# Patient Record
Sex: Female | Born: 1970 | Race: White | Hispanic: No | Marital: Married | Smoking: Never smoker
Health system: Southern US, Community
[De-identification: ages and names within clinical notes are randomized; demographics above are authoritative.]

---

## 1997-07-26 ENCOUNTER — Other Ambulatory Visit: Admission: RE | Admit: 1997-07-26 | Discharge: 1997-07-26 | Payer: Self-pay | Admitting: Emergency Medicine

## 1998-05-04 ENCOUNTER — Other Ambulatory Visit: Admission: RE | Admit: 1998-05-04 | Discharge: 1998-05-04 | Payer: Self-pay | Admitting: Emergency Medicine

## 2002-06-09 ENCOUNTER — Other Ambulatory Visit: Admission: RE | Admit: 2002-06-09 | Discharge: 2002-06-09 | Payer: Self-pay

## 2002-10-10 ENCOUNTER — Other Ambulatory Visit: Admission: RE | Admit: 2002-10-10 | Discharge: 2002-10-10 | Payer: Self-pay

## 2003-02-07 ENCOUNTER — Other Ambulatory Visit: Admission: RE | Admit: 2003-02-07 | Discharge: 2003-02-07 | Payer: Self-pay | Admitting: Obstetrics and Gynecology

## 2003-08-08 ENCOUNTER — Other Ambulatory Visit: Admission: RE | Admit: 2003-08-08 | Discharge: 2003-08-08 | Payer: Self-pay | Admitting: Obstetrics and Gynecology

## 2004-02-05 ENCOUNTER — Other Ambulatory Visit: Admission: RE | Admit: 2004-02-05 | Discharge: 2004-02-05 | Payer: Self-pay | Admitting: Obstetrics and Gynecology

## 2004-11-14 ENCOUNTER — Other Ambulatory Visit: Admission: RE | Admit: 2004-11-14 | Discharge: 2004-11-14 | Payer: Self-pay | Admitting: Obstetrics and Gynecology

## 2004-11-15 ENCOUNTER — Ambulatory Visit (HOSPITAL_COMMUNITY): Admission: RE | Admit: 2004-11-15 | Discharge: 2004-11-15 | Payer: Self-pay | Admitting: Obstetrics and Gynecology

## 2005-03-07 ENCOUNTER — Inpatient Hospital Stay (HOSPITAL_COMMUNITY): Admission: AD | Admit: 2005-03-07 | Discharge: 2005-03-07 | Payer: Self-pay | Admitting: Obstetrics and Gynecology

## 2005-03-24 ENCOUNTER — Inpatient Hospital Stay (HOSPITAL_COMMUNITY): Admission: AD | Admit: 2005-03-24 | Discharge: 2005-03-24 | Payer: Self-pay | Admitting: Obstetrics and Gynecology

## 2005-03-28 ENCOUNTER — Ambulatory Visit: Admission: RE | Admit: 2005-03-28 | Discharge: 2005-03-28 | Payer: Self-pay | Admitting: Obstetrics and Gynecology

## 2005-05-09 ENCOUNTER — Observation Stay (HOSPITAL_COMMUNITY): Admission: AD | Admit: 2005-05-09 | Discharge: 2005-05-10 | Payer: Self-pay | Admitting: Obstetrics and Gynecology

## 2005-05-15 ENCOUNTER — Inpatient Hospital Stay (HOSPITAL_COMMUNITY): Admission: AD | Admit: 2005-05-15 | Discharge: 2005-05-18 | Payer: Self-pay | Admitting: Obstetrics and Gynecology

## 2005-05-16 ENCOUNTER — Encounter (INDEPENDENT_AMBULATORY_CARE_PROVIDER_SITE_OTHER): Payer: Self-pay | Admitting: *Deleted

## 2005-05-24 ENCOUNTER — Inpatient Hospital Stay (HOSPITAL_COMMUNITY): Admission: AD | Admit: 2005-05-24 | Discharge: 2005-05-27 | Payer: Self-pay | Admitting: Obstetrics and Gynecology

## 2005-06-23 ENCOUNTER — Encounter: Admission: RE | Admit: 2005-06-23 | Discharge: 2005-06-23 | Payer: Self-pay | Admitting: Family Medicine

## 2006-03-03 ENCOUNTER — Other Ambulatory Visit: Admission: RE | Admit: 2006-03-03 | Discharge: 2006-03-03 | Payer: Self-pay | Admitting: Obstetrics and Gynecology

## 2006-07-23 ENCOUNTER — Encounter: Admission: RE | Admit: 2006-07-23 | Discharge: 2006-07-23 | Payer: Self-pay | Admitting: Family Medicine

## 2006-11-03 ENCOUNTER — Encounter: Admission: RE | Admit: 2006-11-03 | Discharge: 2006-11-03 | Payer: Self-pay | Admitting: Obstetrics and Gynecology

## 2007-04-12 ENCOUNTER — Other Ambulatory Visit: Admission: RE | Admit: 2007-04-12 | Discharge: 2007-04-12 | Payer: Self-pay | Admitting: Obstetrics and Gynecology

## 2008-04-20 ENCOUNTER — Other Ambulatory Visit: Admission: RE | Admit: 2008-04-20 | Discharge: 2008-04-20 | Payer: Self-pay | Admitting: Obstetrics and Gynecology

## 2009-02-11 ENCOUNTER — Inpatient Hospital Stay (HOSPITAL_COMMUNITY): Admission: AD | Admit: 2009-02-11 | Discharge: 2009-02-14 | Payer: Self-pay | Admitting: Obstetrics and Gynecology

## 2009-02-16 ENCOUNTER — Encounter: Admission: RE | Admit: 2009-02-16 | Discharge: 2009-02-19 | Payer: Self-pay | Admitting: Obstetrics and Gynecology

## 2009-05-07 ENCOUNTER — Other Ambulatory Visit: Admission: RE | Admit: 2009-05-07 | Discharge: 2009-05-07 | Payer: Self-pay | Admitting: Obstetrics and Gynecology

## 2010-03-29 ENCOUNTER — Encounter
Admission: RE | Admit: 2010-03-29 | Discharge: 2010-03-29 | Payer: Self-pay | Source: Home / Self Care | Attending: Family Medicine | Admitting: Family Medicine

## 2010-05-09 ENCOUNTER — Other Ambulatory Visit: Payer: Self-pay | Admitting: Obstetrics and Gynecology

## 2010-05-09 ENCOUNTER — Other Ambulatory Visit (HOSPITAL_COMMUNITY)
Admission: RE | Admit: 2010-05-09 | Discharge: 2010-05-09 | Disposition: A | Discharge status: Home / Self Care | Payer: BC Managed Care – PPO | Source: Ambulatory Visit | Attending: Obstetrics and Gynecology | Admitting: Obstetrics and Gynecology

## 2010-05-09 DIAGNOSIS — Z01419 Encounter for gynecological examination (general) (routine) without abnormal findings: Secondary | ICD-10-CM | POA: Insufficient documentation

## 2010-07-17 LAB — CBC
HCT: 33.2 % — ABNORMAL LOW (ref 36.0–46.0)
HCT: 33.9 % — ABNORMAL LOW (ref 36.0–46.0)
Hemoglobin: 11.2 g/dL — ABNORMAL LOW (ref 12.0–15.0)
Hemoglobin: 11.6 g/dL — ABNORMAL LOW (ref 12.0–15.0)
MCHC: 33.8 g/dL (ref 30.0–36.0)
MCHC: 34.1 g/dL (ref 30.0–36.0)
MCV: 95.7 fL (ref 78.0–100.0)
MCV: 96.3 fL (ref 78.0–100.0)
Platelets: 152 10*3/uL (ref 150–400)
Platelets: 194 10*3/uL (ref 150–400)
RBC: 3.45 MIL/uL — ABNORMAL LOW (ref 3.87–5.11)
RBC: 3.55 MIL/uL — ABNORMAL LOW (ref 3.87–5.11)
RDW: 14.3 % (ref 11.5–15.5)
RDW: 14.5 % (ref 11.5–15.5)
WBC: 12.6 10*3/uL — ABNORMAL HIGH (ref 4.0–10.5)
WBC: 15.4 10*3/uL — ABNORMAL HIGH (ref 4.0–10.5)

## 2010-07-17 LAB — RPR: RPR Ser Ql: NONREACTIVE

## 2010-08-30 NOTE — H&P (Signed)
NAMESHARANYA, TEMPLIN               ACCOUNT NO.:  [id number]   MEDICAL RECORD NO.:  [id number]          PATIENT TYPE:  INP   LOCATION:  9154                          FACILITY:  [facility]   PHYSICIAN:  Gerald Leitz, MD          DATE OF BIRTH:  [birth year]   DATE OF ADMISSION:  05/09/2005  DATE OF DISCHARGE:                                HISTORY & PHYSICAL   A 36 and 4-week intrauterine pregnancy with elevated blood pressures, rule  out preeclampsia.   HISTORY OF PRESENT ILLNESS:  This is a 40 year old G1, P0 at 8 and 4 weeks  intrauterine pregnancy based on last menstrual period of Aug 27, 2004  confirmed by an 8-week ultrasound with an EDD of June 02, 2005.  Pregnancy complicated by advanced maternal age and asthma.  Patient  presented to the office today with complaints of a headache that is  unresolved with Tylenol.  Blood pressure was 140/100 on arrival.  Repeat was  132/92.  She denies any blurred vision or seeing spots.  No right upper  quadrant pain.  No shortness of breath.  Positive fetal movements.  No  vaginal bleeding.  No contractions.  No leakage of fluid.   PRENATAL LABORATORIES:  Blood type O+.  Rh negative.  RPR nonreactive.  Rubella immune.  Hepatitis B negative.  HIV negative.  Glucola is 125.  Pap  smear is normal.  Gonorrhea and Chlamydia tests were both negative.  Group B  Strep positive.   PAST MEDICAL HISTORY:  Asthma and seasonal allergies.   CURRENT MEDICATIONS:  Prenatal vitamins, Advair, albuterol, Claritin,  Zyrtec, Tylenol p.r.n.   ALLERGIES:  None.  No known drug allergies.   PAST OB HISTORY:  Current.   PAST GYN HISTORY:  History of abnormal Pap smears status post LEEP in 2003.  Last Pap smear was August of 2006 and this was normal.  No history of any  other sexually transmitted diseases.   FAMILY HISTORY:  Noncontributory.   SOCIAL HISTORY:  Patient is married.  She denies tobacco, alcohol, or  illicit drug use.   REVIEW OF SYSTEMS:  Negative  except as stated in history of present illness.   PHYSICAL EXAMINATION:  VITAL SIGNS:  Fetal heart rate was 160 in the office,  blood pressure 140/100, repeat 132/92.  CARDIOVASCULAR:  Regular rate and rhythm.  LUNGS:  Clear to auscultation bilaterally without rales, rhonchi, wheezes.  ABDOMEN:  Gravid, nontender.  No right upper quadrant pain with palpation.  EXTREMITIES:  No clubbing, cyanosis, edema.  One beat of clonus.  2+  reflexes.  PELVIC:  Cervical examination is deferred.   IMPRESSION AND PLAN:  36 and 4-week intrauterine pregnancy with elevated  blood pressures.  Will admit to [facility] for bed rest, 24-hour urine  to rule out preeclampsia and HELLP laboratories.      Gerald Leitz, MD  Electronically Signed     TC/MEDQ  D:  05/09/2005  T:  05/09/2005  Job:  [id number]

## 2010-08-30 NOTE — Op Note (Signed)
Katherine Hines, Katherine Hines               ACCOUNT NO.:  [id number]   MEDICAL RECORD NO.:  [id number]          PATIENT TYPE:  INP   LOCATION:  9173                          [facility]ACILITY:  [facility]   PHYSICIAN:  Charles A. Delcambre, MDDATE OF BIRTH:  December 01, 1970   DATE OF PROCEDURE:  05/16/2005  DATE OF DISCHARGE:                                 OPERATIVE REPORT   This patient was admitted for induction secondary to gestational  hypertension, not felt to be preeclamptic, and decreased fetal movement and  possible suspicious decelerations on NST in the office.  She is also  advanced maternal age, group B Strep positive.  She progressed on, after  spontaneous rupture of membranes this morning, to respond well to Pitocin up  to 6 milli-international units/min and became completely dilated at 0834.  She pushed for approximately 1 hour to spontaneous vaginal delivery of  vigorous female, Apgars 8 and 9.  Father of baby cut the cord, nuchal cord  x1, body cord x1 were delivered through.  Cord venous blood gas 7.32,  arterial blood gas difficult with specimen too small to run.  Placenta was  intact but was torn centrally.  Reassembly by putting the segments back  together, they were intact but torn.  Putting them  back together appeared  to be intact.  The placenta was 3 vessels and noted to be spontaneous.  Digital uterine palpation and exploration did not palpate any retained  placental fragments and we made the judgment that if any fever was noted  postpartum would possibly need to proceed with retained placental workup.  In my judgment, I did not feel we needed to proceed with D&C at this time.  There was a small right periurethral skid mark type laceration, no repair  necessary, hemostasis was noted to be good.  There was a left vaginal side  wall laceration approximately 2 cm.  A local anesthetic was used with 2-0  Vicryl to repair, with 3 interrupted stitches, this area with good  hemostasis resulting.   Rectal exam was normal.  Mother and baby recovering  stably at this time.      Charles A. Sydnee Cabal, MD  Electronically Signed     CAD/MEDQ  D:  05/16/2005  T:  05/16/2005  Job:  [id number]

## 2010-08-30 NOTE — H&P (Signed)
Katherine Hines, Katherine Hines               ACCOUNT NO.:  [id number]   MEDICAL RECORD NO.:  [id number]          PATIENT TYPE:  INP   LOCATION:  9173                          [facility]  [facility]   PHYSICIAN:  Charles A. Delcambre, MDDATE OF BIRTH:  [birth year]   DATE OF ADMISSION:  05/15/2005  DATE OF DISCHARGE:                                HISTORY & PHYSICAL   CHIEF COMPLAINT:  1.  Gestational hypertension.  2.  Possible suspicious decelerations on NST in the office.  3.  Recent history of decreased fetal movement with reassuring antenatal      testing yesterday, biophysical 10/10.  4.  Term intrauterine pregnancy 37 weeks 3 days.  5.  Advanced maternal age.  6.  Positive group B strep.  7.  History of asthma, not active at this time; well controlled with      medications.   HISTORY OF PRESENT ILLNESS:  A 39 year old gravida 1 para 0 at 37 weeks 3  days noted to have an NST reactive but two suspicious decelerations after  two of three contractions on the NST. These were very subtle and the patient  is now admitted secondary to ongoing headache and history of recent  decreased fetal movement. Active fetal movement is noted today. Biophysical  profile was 10/10 yesterday. After initial slight decreased movement but  qualifying as active movement but 0 for tone was noted on biophysical  yesterday. In extended monitoring, tone did improve and movement was noted,  adequate. NST was reactive. PIH labs were done May 08, 2005, showing  creatinine 0.8, uric acid 3.8, AST 25, ALT 23. Hemoglobin 12.2, hematocrit  35.3, platelets 277. Group B strep was positive on May 05, 2005.   PAST MEDICAL HISTORY:  1.  Asthma.  2.  Seasonal allergies.  3.  Gestational hypertension as noted above. She has been on bedrest for the      last several weeks, at least on a limited basis. Blood pressure was as      high as 140/100 last weekend, normalized to 130s over 80s with hospital      admission overnight. As  she was in the 36 weeks range she was not      induced with response of her blood pressure and normal labs. Headache      did respond to butalbital and she was treated with such as blood      pressure stabilized and PIH labs were normal.  4.  History of pyelonephritis, not during pregnancy and in the remote past.   MEDICATIONS:  1.  Advair 250/50 once a day.  2.  Zyrtec 10 mg once a day.  3.  Albuterol inhaler p.r.n. q.4-6h. two puffs.  4.  Prenatal vitamin once a day.   PAST SURGICAL HISTORY:  None other than a LEEP.   ALLERGIES:  No known drug allergies.   SOCIAL HISTORY:  She is married. No tobacco, ethanol, or drug use.   REVIEW OF SYSTEMS:  Active fetal movement is noted. No contractions,  rupture, or bleeding is noted at this time. She does not feel the  contractions  we saw on the NST other than some slight tightening with one  around the time of one of the contractions but not clearly at the time that  is marked on the monitor. She has mild headache this morning. No scotomata,  right upper quadrant pain, or nausea or vomiting. No diarrhea, constipation,  or bleeding per rectum. No urgency, frequency or dysuria. No vision changes.  No fever or chills.   PHYSICAL EXAMINATION:  GENERAL:  Alert and oriented x3.  VITAL SIGNS:  Blood pressure 130/88, weight 214 pounds, respirations 18,  pulse 90.  HEENT:  Grossly within normal limits.  NECK:  Supple without thyromegaly or adenopathy.  BACK:  No CVAT.  BREASTS:  Symmetrical; otherwise, exam deferred.  HEART:  Regular rate and rhythm, 2/6 systolic ejection murmur left sternal  border.  LUNGS:  Clear without audible wheezing.  ABDOMEN:  Fundal height 37. Estimated fetal weight 3200 g.  PELVIC:  Cervix fingertip, posterior, and soft. On ultrasound yesterday, the  baby was vertex.  EXTREMITIES:  Mild edema bilaterally and symmetrical. Reflexes 1-2+,  symmetrical.   ASSESSMENT:  1.  Intrauterine pregnancy at term, 37 weeks 3  days.  2.  Gestational hypertension.  3.  Some suspicious subtle decelerations seen on nonstress test today.   PLAN:  Cervidil induction. PIH panel, CBC, diabetes screen upon admission.  Monitor closely. If further decelerations of recurrent nature will proceed  with C-section with remote condition from delivery. Otherwise, continue  Cervidil induction for 12 hours and then likely Pitocin. Dr. Richardson Dopp will be on-  call this evening. If we proceed into the evening with induction, she will  take over management per her orders at that time. If hypertension does  develop of significance or headache persists with hypertension, will  consider magnesium therapy. All questions are answered and will check urine  upon admission as well for protein. All questions are answered and we will  proceed as outlined.      Charles A. Sydnee Cabal, MD  Electronically Signed     CAD/MEDQ  D:  05/15/2005  T:  05/15/2005  Job:  [id number]

## 2010-08-30 NOTE — Discharge Summary (Signed)
[name], Katherine Hines               ACCOUNT NO.:  [id number]   MEDICAL RECORD NO.:  [id number]          PATIENT TYPE:  INP   LOCATION:  9317                          [facility]ILITY[facility]  [facility]   PHYSICIAN:  Charles A. Delcambre, MDDATE OF BIRTH:  Dec 21, 1970   DATE OF ADMISSION:  05/24/2005  DATE OF DISCHARGE:  05/27/2005                                 DISCHARGE SUMMARY   PRIMARY DISCHARGE DIAGNOSES:  1.  Gestational hypertension, 1 week postpartum.  2.  Urinary tract infection.   PROCEDURE:  IV magnesium prophylaxis and p.o. antibiotics, and  antihypertensive therapy.   DISPOSITION:  The patient will return to clinic in 1 week.  She was  discharged home with pregnancy induced hypertension precautions:  To notify  of increased headache or scotomata, right upper quadrant pain or increased  swelling, she was to take labetalol 200 mg p.o. q.12h., Macrobid 100 mg p.o.  q.12h. to complete a 10-day course of Macrobid, notify of any fever or  increased dysuria, also notify of any shortness of breath or wheezing.   LABORATORY:  PIH labs were normal.  Admission SGOT 28, SGPT 30, LDH 237,  uric acid 4.7, urinalysis consistent with rare bacteria, RBCs too numerous  to count, 0 to 2 white blood cells, but she was treated for a urinary tract  infection upon admission.  On CBC furthermore:  Platelets 403, hemoglobin  11.4, hematocrit 32.4 and white count 11.4.  Otherwise, chem profile was  noncontributory and basically unremarkable.   HISTORY AND PHYSICAL:  Written in the chart.   HOSPITAL COURSE:  The patient was admitted to the AICU and magnesium therapy  was undertaken.  She was increased from 100 mg b.i.d. of labetalol, upon  admission, to 200 mg b.i.d.  She was given 1 dose of Ancef IV upon admission  and thereafter put her on Macrobid.  She responded to the antihypertensives  and diuresed well.  Over the first 24 hours, diuresed to a negative balance  of just over 4 L.  She felt much better,  headache decreased and she  continues to do well on hospital day #2 after being off the magnesium, and  had minimal to none headache on hospital day 2 and continued to do well and  she was therefore discharged home with follow up as noted above.      Charles A. Sydnee Cabal, MD  Electronically Signed     CAD/MEDQ  D:  05/27/2005  T:  05/27/2005  Job:  [id number]

## 2011-02-11 ENCOUNTER — Other Ambulatory Visit: Payer: Self-pay | Admitting: Family Medicine

## 2011-02-12 ENCOUNTER — Ambulatory Visit
Admission: RE | Admit: 2011-02-12 | Discharge: 2011-02-12 | Disposition: A | Discharge status: Home / Self Care | Payer: BC Managed Care – PPO | Source: Ambulatory Visit | Attending: Family Medicine | Admitting: Family Medicine

## 2011-02-12 MED ORDER — IOHEXOL 300 MG/ML  SOLN
100.0000 mL | Freq: Once | INTRAMUSCULAR | Status: AC | PRN
  Administered 2011-02-12: 100 mL via INTRAVENOUS

## 2011-06-11 ENCOUNTER — Other Ambulatory Visit (HOSPITAL_COMMUNITY)
Admission: RE | Admit: 2011-06-11 | Discharge: 2011-06-11 | Disposition: A | Discharge status: Home / Self Care | Payer: BC Managed Care – PPO | Source: Ambulatory Visit | Attending: Obstetrics and Gynecology | Admitting: Obstetrics and Gynecology

## 2011-06-11 ENCOUNTER — Other Ambulatory Visit: Payer: Self-pay | Admitting: Obstetrics and Gynecology

## 2011-06-11 DIAGNOSIS — Z01419 Encounter for gynecological examination (general) (routine) without abnormal findings: Secondary | ICD-10-CM | POA: Insufficient documentation

## 2011-06-30 ENCOUNTER — Other Ambulatory Visit: Payer: Self-pay | Admitting: Obstetrics and Gynecology

## 2011-06-30 DIAGNOSIS — Z1231 Encounter for screening mammogram for malignant neoplasm of breast: Secondary | ICD-10-CM

## 2011-07-29 ENCOUNTER — Ambulatory Visit
Admission: RE | Admit: 2011-07-29 | Discharge: 2011-07-29 | Disposition: A | Discharge status: Home / Self Care | Payer: BC Managed Care – PPO | Source: Ambulatory Visit | Attending: Obstetrics and Gynecology | Admitting: Obstetrics and Gynecology

## 2011-07-29 DIAGNOSIS — Z1231 Encounter for screening mammogram for malignant neoplasm of breast: Secondary | ICD-10-CM

## 2011-07-31 ENCOUNTER — Emergency Department (HOSPITAL_COMMUNITY): Payer: BC Managed Care – PPO

## 2011-07-31 ENCOUNTER — Encounter (HOSPITAL_COMMUNITY): Payer: Self-pay | Admitting: *Deleted

## 2011-07-31 ENCOUNTER — Emergency Department (HOSPITAL_COMMUNITY)
Admission: EM | Admit: 2011-07-31 | Discharge: 2011-07-31 | Disposition: A | Discharge status: Home / Self Care | Payer: BC Managed Care – PPO | Attending: Emergency Medicine | Admitting: Emergency Medicine

## 2011-07-31 DIAGNOSIS — S01501A Unspecified open wound of lip, initial encounter: Secondary | ICD-10-CM | POA: Insufficient documentation

## 2011-07-31 DIAGNOSIS — S01511A Laceration without foreign body of lip, initial encounter: Secondary | ICD-10-CM

## 2011-07-31 DIAGNOSIS — W1809XA Striking against other object with subsequent fall, initial encounter: Secondary | ICD-10-CM | POA: Insufficient documentation

## 2011-07-31 DIAGNOSIS — Y9239 Other specified sports and athletic area as the place of occurrence of the external cause: Secondary | ICD-10-CM | POA: Insufficient documentation

## 2011-07-31 DIAGNOSIS — S161XXA Strain of muscle, fascia and tendon at neck level, initial encounter: Secondary | ICD-10-CM

## 2011-07-31 DIAGNOSIS — S139XXA Sprain of joints and ligaments of unspecified parts of neck, initial encounter: Secondary | ICD-10-CM | POA: Insufficient documentation

## 2011-07-31 DIAGNOSIS — M542 Cervicalgia: Secondary | ICD-10-CM | POA: Insufficient documentation

## 2011-07-31 DIAGNOSIS — Y9351 Activity, roller skating (inline) and skateboarding: Secondary | ICD-10-CM | POA: Insufficient documentation

## 2011-07-31 DIAGNOSIS — S60229A Contusion of unspecified hand, initial encounter: Secondary | ICD-10-CM | POA: Insufficient documentation

## 2011-07-31 MED ORDER — HYDROCODONE-ACETAMINOPHEN 5-325 MG PO TABS: 1.0000 | ORAL_TABLET | ORAL | Status: AC | PRN

## 2011-07-31 NOTE — ED Notes (Signed)
Patient transported to X-ray 

## 2011-07-31 NOTE — ED Provider Notes (Signed)
History     CSN: [id number]  Arrival date & time 07/31/11  2019   First MD Initiated Contact with Patient 07/31/11 2100      Chief Complaint  Patient presents with  . Fall    (Consider location/radiation/quality/duration/timing/severity/associated sxs/prior treatment) HPI Comments: Patient here after slipping while skating with her family - reports that she ran into a pole face first, then fell onto her right hand - denies LOC, reports laceration to right upper lip, neck pain, and right hand pain.  No deformity noted.  Patient is a 40 y.o. female presenting with fall. The history is provided by the patient. No language interpreter was used.  Fall The accident occurred 1 to 2 hours ago. The fall occurred while recreating/playing. She fell from a height of 3 to 5 ft. She landed on a hard floor. The volume of blood lost was minimal. The point of impact was the head. The pain is present in the head (neck and right hand). The pain is at a severity of 5/10. The pain is moderate. She was ambulatory at the scene. There was no entrapment after the fall. There was no drug use involved in the accident. There was no alcohol use involved in the accident. Associated symptoms include headaches. Pertinent negatives include no visual change, no fever, no numbness, no abdominal pain, no bowel incontinence, no nausea, no vomiting, no hematuria, no hearing loss, no loss of consciousness and no tingling. The symptoms are aggravated by activity. Treatment on scene includes a c-collar. She has tried nothing for the symptoms. The treatment provided no relief.    History reviewed. No pertinent past medical history.  History reviewed. No pertinent past surgical history.  History reviewed. No pertinent family history.  History  Substance Use Topics  . Smoking status: Never Smoker   . Smokeless tobacco: Not on file  . Alcohol Use: No    OB History    Grav Para Term Preterm Abortions TAB SAB Ect Mult Living                    Review of Systems  Constitutional: Negative for fever.  HENT: Positive for facial swelling, neck pain and neck stiffness. Negative for nosebleeds and congestion.   Eyes: Negative for pain.  Respiratory: Negative for chest tightness and shortness of breath.   Gastrointestinal: Negative for nausea, vomiting, abdominal pain and bowel incontinence.  Genitourinary: Negative for hematuria.  Musculoskeletal: Positive for joint swelling and arthralgias. Negative for back pain.  Neurological: Positive for headaches. Negative for tingling, loss of consciousness and numbness.  All other systems reviewed and are negative.    Allergies  Review of patient's allergies indicates no known allergies.  Home Medications   Current Outpatient Rx  Name Route Sig Dispense Refill  . CETIRIZINE HCL 10 MG PO TABS Oral Take 10 mg by mouth every morning.    Marland Kitchen ZOVIA 1/35E (28) PO Oral Take 1 tablet by mouth every morning.    Marland Kitchen FLUTICASONE-SALMETEROL 250-50 MCG/DOSE IN AEPB Inhalation Inhale 1 puff into the lungs every morning. May take 1 additional puff in the evening if needed for wheezing    . ADULT MULTIVITAMIN W/MINERALS CH Oral Take 1 tablet by mouth every morning.      BP 133/84  Pulse 81  Temp(Src) 97.3 F (36.3 C) (Oral)  Resp 18  SpO2 98%  LMP 07/17/2011  Physical Exam  Nursing note and vitals reviewed. Constitutional: She is oriented to person, place, and time. She  appears well-developed and well-nourished. No distress.  HENT:  Head: Normocephalic.  Right Ear: External ear normal.  Left Ear: External ear normal.  Nose: Nose normal.  Mouth/Throat: Oropharynx is clear and moist. No oropharyngeal exudate.       Right upper lip laceration through vermillion border but not into buccal mucosa.  Eyes: Conjunctivae are normal. Pupils are equal, round, and reactive to light. No scleral icterus.  Neck: Normal range of motion. Neck supple. Spinous process tenderness and muscular  tenderness present.  Cardiovascular: Normal rate, regular rhythm and normal heart sounds.  Exam reveals no gallop and no friction rub.   No murmur heard. Pulmonary/Chest: Effort normal and breath sounds normal. No respiratory distress. She has no wheezes. She has no rales. She exhibits no tenderness.  Abdominal: Soft. Bowel sounds are normal. She exhibits no distension. There is no tenderness.  Musculoskeletal: Normal range of motion. She exhibits tenderness. She exhibits no edema.       Right hand: She exhibits tenderness and bony tenderness. She exhibits normal range of motion, normal capillary refill, no deformity, no laceration and no swelling. normal sensation noted. Normal strength noted.       Hands: Lymphadenopathy:    She has no cervical adenopathy.  Neurological: She is alert and oriented to person, place, and time. No cranial nerve deficit.  Skin: Skin is warm and dry. No rash noted. No erythema. No pallor.  Psychiatric: She has a normal mood and affect. Her behavior is normal. Judgment and thought content normal.    ED Course  Procedures (including critical care time)  Labs Reviewed - No data to display Ct Cervical Spine Wo Contrast  07/31/2011  *RADIOLOGY REPORT*  Clinical Data:  Status post fall; hit face on pole.  Hyperextension injury to the neck, with laceration to the upper lip and chipped tooth.  CT MAXILLOFACIAL WITHOUT CONTRAST CT CERVICAL SPINE WITHOUT CONTRAST  Technique:  Multidetector CT imaging of the cervical spine, and maxillofacial structures were performed using the standard protocol without intravenous contrast. Multiplanar CT image reconstructions of the cervical spine and maxillofacial structures were also generated.  Comparison:   None  CT MAXILLOFACIAL  Findings:  There is no evidence of fracture or dislocation.  The maxilla and mandible appear intact.  The nasal bone is unremarkable in appearance.  The known chipped tooth is not well characterized on CT.  The  orbits are intact bilaterally.  The paranasal sinuses and mastoid air cells are well-aerated.  A soft tissue defect is noted along the right upper lip.  The parapharyngeal fat planes are preserved.  The nasopharynx, oropharynx and hypopharynx are unremarkable in appearance.  The visualized portions of the valleculae and piriform sinuses are grossly unremarkable.  The parotid and submandibular glands are within normal limits.  No cervical lymphadenopathy is seen.  IMPRESSION:  1.  No evidence of fracture or dislocation; known chipped tooth is not well characterized on CT. 2.  Soft tissue defect along the right upper lip.  CT CERVICAL SPINE  Findings:   There is no evidence of fracture or subluxation. Vertebral bodies demonstrate normal height and alignment. Intervertebral disc spaces are preserved.  Prevertebral soft tissues are within normal limits.  The visualized neural foramina are grossly unremarkable.  The thyroid gland is unremarkable in appearance.  The visualized lung apices are clear.  No significant soft tissue abnormalities are seen; scattered superficial soft tissue nodules likely reflect normal lymph nodes.  IMPRESSION:  No evidence of fracture or subluxation along the  cervical spine.  Original Report Authenticated By: Tonia Ghent, M.D.   Dg Hand Complete Right  07/31/2011  *RADIOLOGY REPORT*  Clinical Data: Fall.  Right wrist and thumb pain.  RIGHT HAND - COMPLETE 3+ VIEW  Comparison: None.  Findings: The wrist is located.  No acute bone or soft tissue abnormality is evident.  IMPRESSION: Negative right hand.  Original Report Authenticated By: Jamesetta Orleans. MATTERN, M.D.   Ct Maxillofacial Wo Cm  07/31/2011  *RADIOLOGY REPORT*  Clinical Data:  Status post fall; hit face on pole.  Hyperextension injury to the neck, with laceration to the upper lip and chipped tooth.  CT MAXILLOFACIAL WITHOUT CONTRAST CT CERVICAL SPINE WITHOUT CONTRAST  Technique:  Multidetector CT imaging of the cervical spine,  and maxillofacial structures were performed using the standard protocol without intravenous contrast. Multiplanar CT image reconstructions of the cervical spine and maxillofacial structures were also generated.  Comparison:   None  CT MAXILLOFACIAL  Findings:  There is no evidence of fracture or dislocation.  The maxilla and mandible appear intact.  The nasal bone is unremarkable in appearance.  The known chipped tooth is not well characterized on CT.  The orbits are intact bilaterally.  The paranasal sinuses and mastoid air cells are well-aerated.  A soft tissue defect is noted along the right upper lip.  The parapharyngeal fat planes are preserved.  The nasopharynx, oropharynx and hypopharynx are unremarkable in appearance.  The visualized portions of the valleculae and piriform sinuses are grossly unremarkable.  The parotid and submandibular glands are within normal limits.  No cervical lymphadenopathy is seen.  IMPRESSION:  1.  No evidence of fracture or dislocation; known chipped tooth is not well characterized on CT. 2.  Soft tissue defect along the right upper lip.  CT CERVICAL SPINE  Findings:   There is no evidence of fracture or subluxation. Vertebral bodies demonstrate normal height and alignment. Intervertebral disc spaces are preserved.  Prevertebral soft tissues are within normal limits.  The visualized neural foramina are grossly unremarkable.  The thyroid gland is unremarkable in appearance.  The visualized lung apices are clear.  No significant soft tissue abnormalities are seen; scattered superficial soft tissue nodules likely reflect normal lymph nodes.  IMPRESSION:  No evidence of fracture or subluxation along the cervical spine.  Original Report Authenticated By: Tonia Ghent, M.D.   LACERATION REPAIR Performed by: Patrecia Pour. Authorized by: Patrecia Pour Consent: Verbal consent obtained. Risks and benefits: risks, benefits and alternatives were discussed Consent given by:  patient Patient identity confirmed: provided demographic data Prepped and Draped in normal sterile fashion Wound explored  Laceration Location: right upper lip  Laceration Length: 2cm  No Foreign Bodies seen or palpated  Anesthesia: local infiltration  Local anesthetic: lidocaine 2% without epinephrine  Anesthetic total: 2 ml  Irrigation method: syringe Amount of cleaning: standard  Skin closure: nylon 7.0 outside and vicryl 6.0 inside  Number of sutures: 3 nylon on outside and 2 vicryl on inside  Technique: simple interrupted.  Patient tolerance: Patient tolerated the procedure well with no immediate complications.  Cervical Strain Upper lip laceration Right hand contusion    MDM  Patient here s/p fall with right hand contusion without fracture noted.  Cervical strain without weakness, numbness, tingling, loss of control of bowels or bladder and upper lip laceration.        Izola Price [place], [place] 07/31/11 2308

## 2011-07-31 NOTE — ED Provider Notes (Signed)
Medical screening examination/treatment/procedure(s) were performed by non-physician practitioner and as supervising physician I was immediately available for consultation/collaboration.   Loren Racer, MD 07/31/11 [phone]

## 2011-07-31 NOTE — Discharge Instructions (Signed)
Cervical Sprain A cervical sprain is an injury in the neck in which the ligaments are stretched or torn. The ligaments are the tissues that hold the bones of the neck (vertebrae) in place.Cervical sprains can range from very mild to very severe. Most cervical sprains get better in 1 to 3 weeks, but it depends on the cause and extent of the injury. Severe cervical sprains can cause the neck vertebrae to be unstable. This can lead to damage of the spinal cord and can result in serious nervous system problems. Your caregiver will determine whether your cervical sprain is mild or severe. CAUSES  Severe cervical sprains may be caused by:  Contact sport injuries (football, rugby, wrestling, hockey, auto racing, gymnastics, diving, martial arts, boxing).   Motor vehicle collisions.   Whiplash injuries. This means the neck is forcefully whipped backward and forward.   Falls.  Mild cervical sprains may be caused by:   Awkward positions, such as cradling a telephone between your ear and shoulder.   Sitting in a chair that does not offer proper support.   Working at a poorly designed computer station.   Activities that require looking up or down for long periods of time.  SYMPTOMS   Pain, soreness, stiffness, or a burning sensation in the front, back, or sides of the neck. This discomfort may develop immediately after injury or it may develop slowly and not begin for 24 hours or more after an injury.   Pain or tenderness directly in the middle of the back of the neck.   Shoulder or upper back pain.   Limited ability to move the neck.   Headache.   Dizziness.   Weakness, numbness, or tingling in the hands or arms.   Muscle spasms.   Difficulty swallowing or chewing.   Tenderness and swelling of the neck.  DIAGNOSIS  Most of the time, your caregiver can diagnose this problem by taking your history and doing a physical exam. Your caregiver will ask about any known problems, such as  arthritis in the neck or a previous neck injury. X-rays may be taken to find out if there are any other problems, such as problems with the bones of the neck. However, an X-ray often does not reveal the full extent of a cervical sprain. Other tests such as a computed tomography (CT) scan or magnetic resonance imaging (MRI) may be needed. TREATMENT  Treatment depends on the severity of the cervical sprain. Mild sprains can be treated with rest, keeping the neck in place (immobilization), and pain medicines. Severe cervical sprains need immediate immobilization and an appointment with an orthopedist or neurosurgeon. Several treatment options are available to help with pain, muscle spasms, and other symptoms. Your caregiver may prescribe:  Medicines, such as pain relievers, numbing medicines, or muscle relaxants.   Physical therapy. This can include stretching exercises, strengthening exercises, and posture training. Exercises and improved posture can help stabilize the neck, strengthen muscles, and help stop symptoms from returning.   A neck collar to be worn for short periods of time. Often, these collars are worn for comfort. However, certain collars may be worn to protect the neck and prevent further worsening of a serious cervical sprain.  HOME CARE INSTRUCTIONS   Put ice on the injured area.   Put ice in a plastic bag.   Place a towel between your skin and the bag.   Leave the ice on for 15 to 20 minutes, 3 to 4 times a day.     Only take over-the-counter or prescription medicines for pain, discomfort, or fever as directed by your caregiver.   Keep all follow-up appointments as directed by your caregiver.   Keep all physical therapy appointments as directed by your caregiver.   If a neck collar is prescribed, wear it as directed by your caregiver.   Do not drive while wearing a neck collar.   Make any needed adjustments to your work station to promote good posture.   Avoid positions  and activities that make your symptoms worse.   Warm up and stretch before being active to help prevent problems.  SEEK MEDICAL CARE IF:   Your pain is not controlled with medicine.   You are unable to decrease your pain medicine over time as planned.   Your activity level is not improving as expected.  SEEK IMMEDIATE MEDICAL CARE IF:   You develop any bleeding, stomach upset, or signs of an allergic reaction to your medicine.   Your symptoms get worse.   You develop new, unexplained symptoms.   You have numbness, tingling, weakness, or paralysis in any part of your body.  MAKE SURE YOU:   Understand these instructions.   Will watch your condition.   Will get help right away if you are not doing well or get worse.  Document Released: 01/26/2007 Document Revised: 03/20/2011 Document Reviewed: 01/01/2011 [facility] Patient Information 2012 [place], [place].Absorbable Suture Repair Absorbable sutures (stitches) hold skin together so you can heal. Keep skin wounds clean and dry for the next 2 to 3 days. Then, you may gently wash your wound and dress it with an antibiotic ointment as recommended. As your wound begins to heal, the sutures are no longer needed, and they typically begin to fall off. This will take 7 to 10 days. After 10 days, if your sutures are loose, you can remove them by wiping with a clean gauze pad or a cotton ball. Do not pull your sutures out. They should wipe away easily. If after 10 days they do not easily wipe away, have your caregiver take them out. Absorbable sutures may be used deep in a wound to help hold it together. If these stitches are below the skin, the body will absorb them completely in 3 to 4 weeks.  You may need a tetanus shot if:  You cannot remember when you had your last tetanus shot.   You have never had a tetanus shot.  If you get a tetanus shot, your arm may swell, get red, and feel warm to the touch. This is common and not a problem. If you need  a tetanus shot and you choose not to have one, there is a rare chance of getting tetanus. Sickness from tetanus can be serious. SEEK IMMEDIATE MEDICAL CARE IF:  You have redness in the wound area.   The wound area feels hot to the touch.   You develop swelling in the wound area.   You develop pain.   There is fluid drainage from the wound.  Document Released: 05/08/2004 Document Revised: 03/20/2011 Document Reviewed: 08/20/2010 [facility] Patient Information 2012 [place], [place].Contusion A contusion is a deep bruise. Contusions are the result of an injury that caused bleeding under the skin. The contusion may turn blue, purple, or yellow. Minor injuries will give you a painless contusion, but more severe contusions may stay painful and swollen for a few weeks.  CAUSES  A contusion is usually caused by a blow, trauma, or direct force to an area of the  body. SYMPTOMS   Swelling and redness of the injured area.   Bruising of the injured area.   Tenderness and soreness of the injured area.   Pain.  DIAGNOSIS  The diagnosis can be made by taking a history and physical exam. An X-ray, CT scan, or MRI may be needed to determine if there were any associated injuries, such as fractures. TREATMENT  Specific treatment will depend on what area of the body was injured. In general, the best treatment for a contusion is resting, icing, elevating, and applying cold compresses to the injured area. Over-the-counter medicines may also be recommended for pain control. Ask your caregiver what the best treatment is for your contusion. HOME CARE INSTRUCTIONS   Put ice on the injured area.   Put ice in a plastic bag.   Place a towel between your skin and the bag.   Leave the ice on for 15 to 20 minutes, 3 to 4 times a day.   Only take over-the-counter or prescription medicines for pain, discomfort, or fever as directed by your caregiver. Your caregiver may recommend avoiding anti-inflammatory  medicines (aspirin, ibuprofen, and naproxen) for 48 hours because these medicines may increase bruising.   Rest the injured area.   If possible, elevate the injured area to reduce swelling.  SEEK IMMEDIATE MEDICAL CARE IF:   You have increased bruising or swelling.   You have pain that is getting worse.   Your swelling or pain is not relieved with medicines.  MAKE SURE YOU:   Understand these instructions.   Will watch your condition.   Will get help right away if you are not doing well or get worse.  Document Released: 01/08/2005 Document Revised: 03/20/2011 Document Reviewed: 02/03/2011 [facility] Patient Information 2012 [place], [place].Laceration Care, Adult A laceration is a cut or lesion that goes through all layers of the skin and into the tissue just beneath the skin. TREATMENT  Some lacerations may not require closure. Some lacerations may not be able to be closed due to an increased risk of infection. It is important to see your caregiver as soon as possible after an injury to minimize the risk of infection and maximize the opportunity for successful closure. If closure is appropriate, pain medicines may be given, if needed. The wound will be cleaned to help prevent infection. Your caregiver will use stitches (sutures), staples, wound glue (adhesive), or skin adhesive strips to repair the laceration. These tools bring the skin edges together to allow for faster healing and a better cosmetic outcome. However, all wounds will heal with a scar. Once the wound has healed, scarring can be minimized by covering the wound with sunscreen during the day for 1 full year. HOME CARE INSTRUCTIONS  For sutures or staples:  Keep the wound clean and dry.   If you were given a bandage (dressing), you should change it at least once a day. Also, change the dressing if it becomes wet or dirty, or as directed by your caregiver.   Wash the wound with soap and water 2 times a day. Rinse the wound  off with water to remove all soap. Pat the wound dry with a clean towel.   After cleaning, apply a thin layer of the antibiotic ointment as recommended by your caregiver. This will help prevent infection and keep the dressing from sticking.   You may shower as usual after the first 24 hours. Do not soak the wound in water until the sutures are removed.   Only  take over-the-counter or prescription medicines for pain, discomfort, or fever as directed by your caregiver.   Get your sutures or staples removed as directed by your caregiver.  For skin adhesive strips:  Keep the wound clean and dry.   Do not get the skin adhesive strips wet. You may bathe carefully, using caution to keep the wound dry.   If the wound gets wet, pat it dry with a clean towel.   Skin adhesive strips will fall off on their own. You may trim the strips as the wound heals. Do not remove skin adhesive strips that are still stuck to the wound. They will fall off in time.  For wound adhesive:  You may briefly wet your wound in the shower or bath. Do not soak or scrub the wound. Do not swim. Avoid periods of heavy perspiration until the skin adhesive has fallen off on its own. After showering or bathing, gently pat the wound dry with a clean towel.   Do not apply liquid medicine, cream medicine, or ointment medicine to your wound while the skin adhesive is in place. This may loosen the film before your wound is healed.   If a dressing is placed over the wound, be careful not to apply tape directly over the skin adhesive. This may cause the adhesive to be pulled off before the wound is healed.   Avoid prolonged exposure to sunlight or tanning lamps while the skin adhesive is in place. Exposure to ultraviolet light in the first year will darken the scar.   The skin adhesive will usually remain in place for 5 to 10 days, then naturally fall off the skin. Do not pick at the adhesive film.  You may need a tetanus shot  if:  You cannot remember when you had your last tetanus shot.   You have never had a tetanus shot.  If you get a tetanus shot, your arm may swell, get red, and feel warm to the touch. This is common and not a problem. If you need a tetanus shot and you choose not to have one, there is a rare chance of getting tetanus. Sickness from tetanus can be serious. SEEK MEDICAL CARE IF:   You have redness, swelling, or increasing pain in the wound.   You see a red line that goes away from the wound.   You have yellowish-white fluid (pus) coming from the wound.   You have a fever.   You notice a bad smell coming from the wound or dressing.   Your wound breaks open before or after sutures have been removed.   You notice something coming out of the wound such as wood or glass.   Your wound is on your hand or foot and you cannot move a finger or toe.  SEEK IMMEDIATE MEDICAL CARE IF:   Your pain is not controlled with prescribed medicine.   You have severe swelling around the wound causing pain and numbness or a change in color in your arm, hand, leg, or foot.   Your wound splits open and starts bleeding.   You have worsening numbness, weakness, or loss of function of any joint around or beyond the wound.   You develop painful lumps near the wound or on the skin anywhere on your body.  MAKE SURE YOU:   Understand these instructions.   Will watch your condition.   Will get help right away if you are not doing well or get worse.  Document Released: 03/31/2005  Document Revised: 03/20/2011 Document Reviewed: 09/24/2010 [facility] Patient Information 2012 [place], [place].

## 2011-07-31 NOTE — ED Notes (Signed)
MD at bedside. 

## 2011-07-31 NOTE — ED Notes (Signed)
[place] onto her face on the pole.  Patient states htat she felt her neck go back and has a laceration to her upper lip and her tooth chip ped

## 2012-06-21 ENCOUNTER — Other Ambulatory Visit: Payer: Self-pay | Admitting: Obstetrics and Gynecology

## 2012-06-21 ENCOUNTER — Other Ambulatory Visit (HOSPITAL_COMMUNITY)
Admission: RE | Admit: 2012-06-21 | Discharge: 2012-06-21 | Disposition: A | Discharge status: Home / Self Care | Payer: BC Managed Care – PPO | Source: Ambulatory Visit | Attending: Obstetrics and Gynecology | Admitting: Obstetrics and Gynecology

## 2012-06-21 DIAGNOSIS — Z1151 Encounter for screening for human papillomavirus (HPV): Secondary | ICD-10-CM | POA: Insufficient documentation

## 2012-06-21 DIAGNOSIS — Z01419 Encounter for gynecological examination (general) (routine) without abnormal findings: Secondary | ICD-10-CM | POA: Insufficient documentation

## 2012-07-07 ENCOUNTER — Other Ambulatory Visit: Payer: Self-pay

## 2012-07-07 DIAGNOSIS — Z1231 Encounter for screening mammogram for malignant neoplasm of breast: Secondary | ICD-10-CM

## 2012-07-29 ENCOUNTER — Ambulatory Visit
Admission: RE | Admit: 2012-07-29 | Discharge: 2012-07-29 | Disposition: A | Discharge status: Home / Self Care | Payer: BC Managed Care – PPO | Source: Ambulatory Visit

## 2012-07-29 DIAGNOSIS — Z1231 Encounter for screening mammogram for malignant neoplasm of breast: Secondary | ICD-10-CM

## 2013-06-21 ENCOUNTER — Other Ambulatory Visit: Payer: Self-pay | Admitting: Obstetrics and Gynecology

## 2013-06-21 ENCOUNTER — Other Ambulatory Visit (HOSPITAL_COMMUNITY)
Admission: RE | Admit: 2013-06-21 | Discharge: 2013-06-21 | Disposition: A | Discharge status: Home / Self Care | Payer: BC Managed Care – PPO | Source: Ambulatory Visit | Attending: Obstetrics and Gynecology | Admitting: Obstetrics and Gynecology

## 2013-06-21 DIAGNOSIS — Z01419 Encounter for gynecological examination (general) (routine) without abnormal findings: Secondary | ICD-10-CM | POA: Insufficient documentation

## 2013-07-22 ENCOUNTER — Other Ambulatory Visit: Payer: Self-pay

## 2013-07-22 DIAGNOSIS — Z1231 Encounter for screening mammogram for malignant neoplasm of breast: Secondary | ICD-10-CM

## 2013-08-02 ENCOUNTER — Ambulatory Visit
Admission: RE | Admit: 2013-08-02 | Discharge: 2013-08-02 | Disposition: A | Discharge status: Home / Self Care | Payer: BC Managed Care – PPO | Source: Ambulatory Visit

## 2013-08-02 ENCOUNTER — Encounter (INDEPENDENT_AMBULATORY_CARE_PROVIDER_SITE_OTHER): Payer: Self-pay

## 2013-08-02 DIAGNOSIS — Z1231 Encounter for screening mammogram for malignant neoplasm of breast: Secondary | ICD-10-CM

## 2013-08-04 ENCOUNTER — Other Ambulatory Visit: Payer: Self-pay

## 2013-08-04 DIAGNOSIS — R928 Other abnormal and inconclusive findings on diagnostic imaging of breast: Secondary | ICD-10-CM

## 2013-08-10 ENCOUNTER — Other Ambulatory Visit: Payer: Self-pay | Admitting: Family Medicine

## 2013-08-10 DIAGNOSIS — R928 Other abnormal and inconclusive findings on diagnostic imaging of breast: Secondary | ICD-10-CM

## 2013-08-16 ENCOUNTER — Ambulatory Visit
Admission: RE | Admit: 2013-08-16 | Discharge: 2013-08-16 | Disposition: A | Discharge status: Home / Self Care | Payer: BC Managed Care – PPO | Source: Ambulatory Visit | Attending: *Deleted | Admitting: *Deleted

## 2013-08-16 DIAGNOSIS — R928 Other abnormal and inconclusive findings on diagnostic imaging of breast: Secondary | ICD-10-CM

## 2013-12-02 ENCOUNTER — Other Ambulatory Visit: Payer: Self-pay | Admitting: Family Medicine

## 2014-06-27 ENCOUNTER — Other Ambulatory Visit: Payer: Self-pay | Admitting: Obstetrics and Gynecology

## 2014-06-27 ENCOUNTER — Other Ambulatory Visit (HOSPITAL_COMMUNITY)
Admission: RE | Admit: 2014-06-27 | Discharge: 2014-06-27 | Disposition: A | Discharge status: Home / Self Care | Payer: 59 | Source: Ambulatory Visit | Attending: Obstetrics and Gynecology | Admitting: Obstetrics and Gynecology

## 2014-06-27 DIAGNOSIS — Z01419 Encounter for gynecological examination (general) (routine) without abnormal findings: Secondary | ICD-10-CM | POA: Diagnosis not present

## 2014-06-29 LAB — CYTOLOGY - PAP

## 2014-07-17 ENCOUNTER — Other Ambulatory Visit: Payer: Self-pay

## 2014-07-17 DIAGNOSIS — Z1231 Encounter for screening mammogram for malignant neoplasm of breast: Secondary | ICD-10-CM

## 2014-08-07 ENCOUNTER — Ambulatory Visit
Admission: RE | Admit: 2014-08-07 | Discharge: 2014-08-07 | Disposition: A | Discharge status: Home / Self Care | Payer: 59 | Source: Ambulatory Visit

## 2014-08-07 DIAGNOSIS — Z1231 Encounter for screening mammogram for malignant neoplasm of breast: Secondary | ICD-10-CM

## 2015-07-16 ENCOUNTER — Other Ambulatory Visit (HOSPITAL_COMMUNITY)
Admission: RE | Admit: 2015-07-16 | Discharge: 2015-07-16 | Disposition: A | Discharge status: Home / Self Care | Payer: 59 | Source: Ambulatory Visit | Attending: Obstetrics and Gynecology | Admitting: Obstetrics and Gynecology

## 2015-07-16 ENCOUNTER — Other Ambulatory Visit: Payer: Self-pay | Admitting: Obstetrics and Gynecology

## 2015-07-16 DIAGNOSIS — Z01419 Encounter for gynecological examination (general) (routine) without abnormal findings: Secondary | ICD-10-CM | POA: Diagnosis present

## 2015-07-16 DIAGNOSIS — Z1151 Encounter for screening for human papillomavirus (HPV): Secondary | ICD-10-CM | POA: Diagnosis not present

## 2015-07-18 LAB — CYTOLOGY - PAP

## 2015-07-20 ENCOUNTER — Other Ambulatory Visit: Payer: Self-pay

## 2015-07-20 DIAGNOSIS — Z1231 Encounter for screening mammogram for malignant neoplasm of breast: Secondary | ICD-10-CM

## 2015-08-22 ENCOUNTER — Ambulatory Visit
Admission: RE | Admit: 2015-08-22 | Discharge: 2015-08-22 | Disposition: A | Discharge status: Home / Self Care | Payer: 59 | Source: Ambulatory Visit

## 2015-08-22 DIAGNOSIS — Z1231 Encounter for screening mammogram for malignant neoplasm of breast: Secondary | ICD-10-CM

## 2015-11-06 ENCOUNTER — Ambulatory Visit
Admission: RE | Admit: 2015-11-06 | Discharge: 2015-11-06 | Disposition: A | Discharge status: Home / Self Care | Payer: 59 | Source: Ambulatory Visit | Attending: Family Medicine | Admitting: Family Medicine

## 2015-11-06 ENCOUNTER — Other Ambulatory Visit: Payer: Self-pay | Admitting: Family Medicine

## 2015-11-06 DIAGNOSIS — R52 Pain, unspecified: Secondary | ICD-10-CM

## 2016-07-15 ENCOUNTER — Other Ambulatory Visit: Payer: Self-pay | Admitting: Family Medicine

## 2016-07-15 DIAGNOSIS — Z1231 Encounter for screening mammogram for malignant neoplasm of breast: Secondary | ICD-10-CM

## 2016-08-22 ENCOUNTER — Ambulatory Visit: Payer: 59

## 2016-09-09 ENCOUNTER — Ambulatory Visit: Payer: 59

## 2016-09-10 ENCOUNTER — Ambulatory Visit
Admission: RE | Admit: 2016-09-10 | Discharge: 2016-09-10 | Disposition: A | Discharge status: Home / Self Care | Payer: BLUE CROSS/BLUE SHIELD | Source: Ambulatory Visit | Attending: Family Medicine | Admitting: Family Medicine

## 2016-09-10 DIAGNOSIS — Z1231 Encounter for screening mammogram for malignant neoplasm of breast: Secondary | ICD-10-CM

## 2017-08-21 ENCOUNTER — Other Ambulatory Visit: Payer: Self-pay | Admitting: Family Medicine

## 2017-08-21 DIAGNOSIS — Z1231 Encounter for screening mammogram for malignant neoplasm of breast: Secondary | ICD-10-CM

## 2017-09-15 ENCOUNTER — Ambulatory Visit
Admission: RE | Admit: 2017-09-15 | Discharge: 2017-09-15 | Disposition: A | Discharge status: Home / Self Care | Payer: BLUE CROSS/BLUE SHIELD | Source: Ambulatory Visit | Attending: Family Medicine | Admitting: Family Medicine

## 2017-09-15 DIAGNOSIS — Z1231 Encounter for screening mammogram for malignant neoplasm of breast: Secondary | ICD-10-CM

## 2018-11-27 IMAGING — MG DIGITAL SCREENING BILATERAL MAMMOGRAM WITH TOMO AND CAD
8 series · 8 of 24 positions shown · non-contrast
Comparison: Previous exam(s).

CLINICAL DATA: Screening.

EXAM:
DIGITAL SCREENING BILATERAL MAMMOGRAM WITH TOMO AND CAD

[R MLO synth-2D]
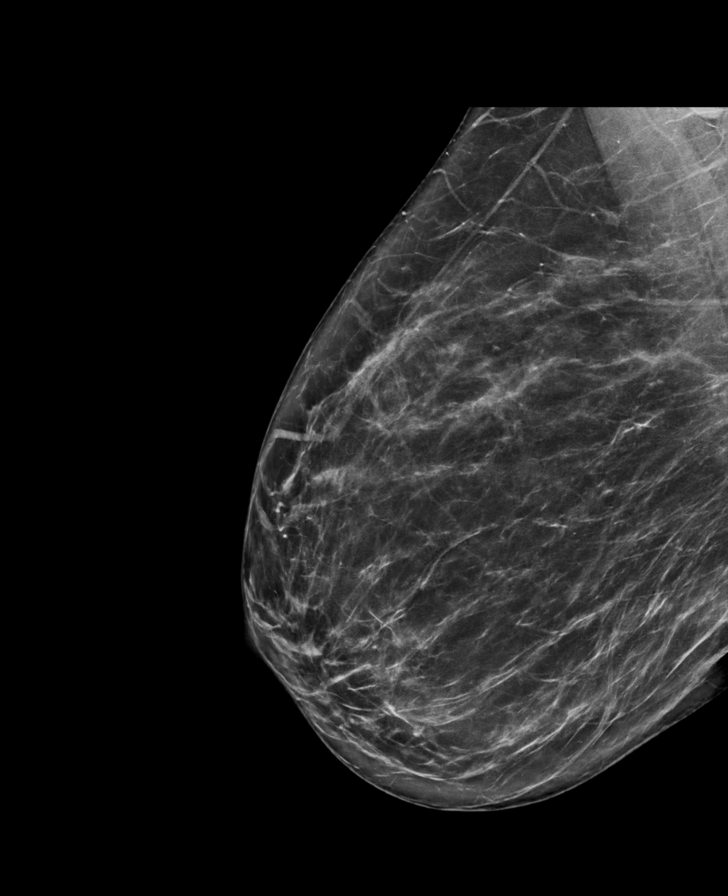

[L CC synth-2D]
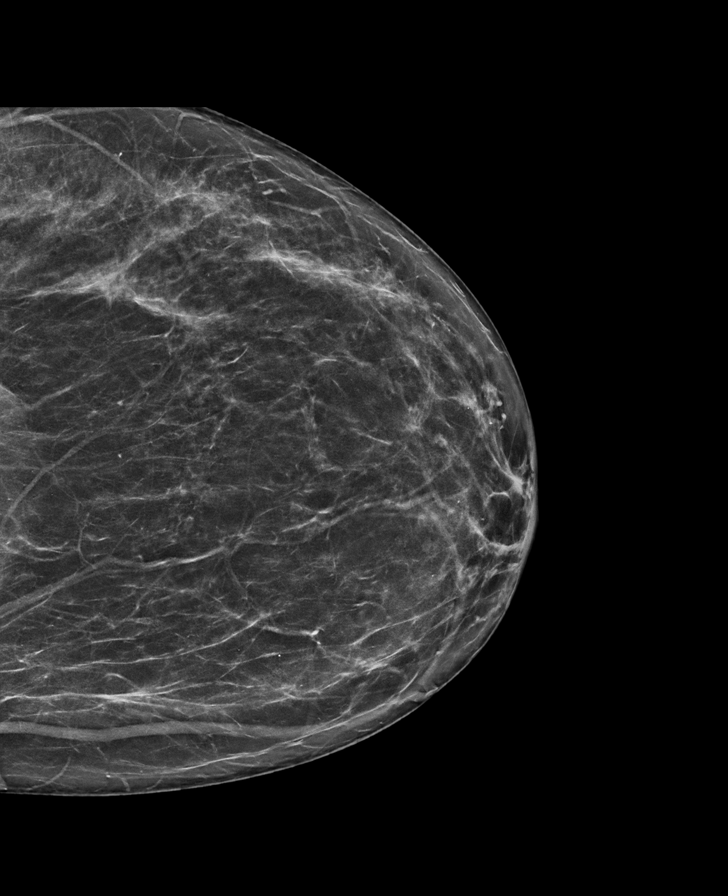

[R CC synth-2D]
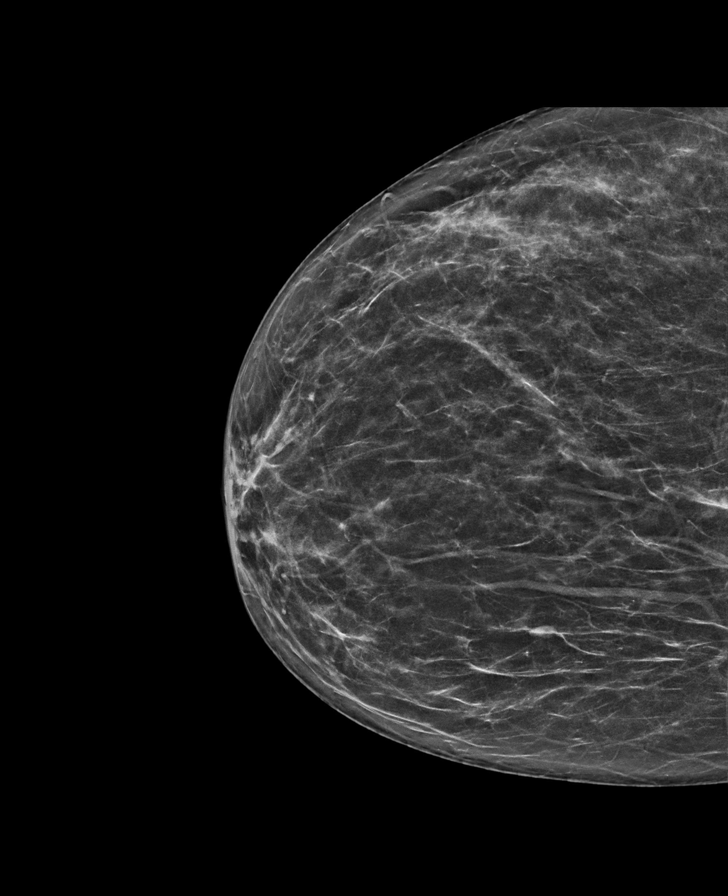

[L MLO synth-2D]
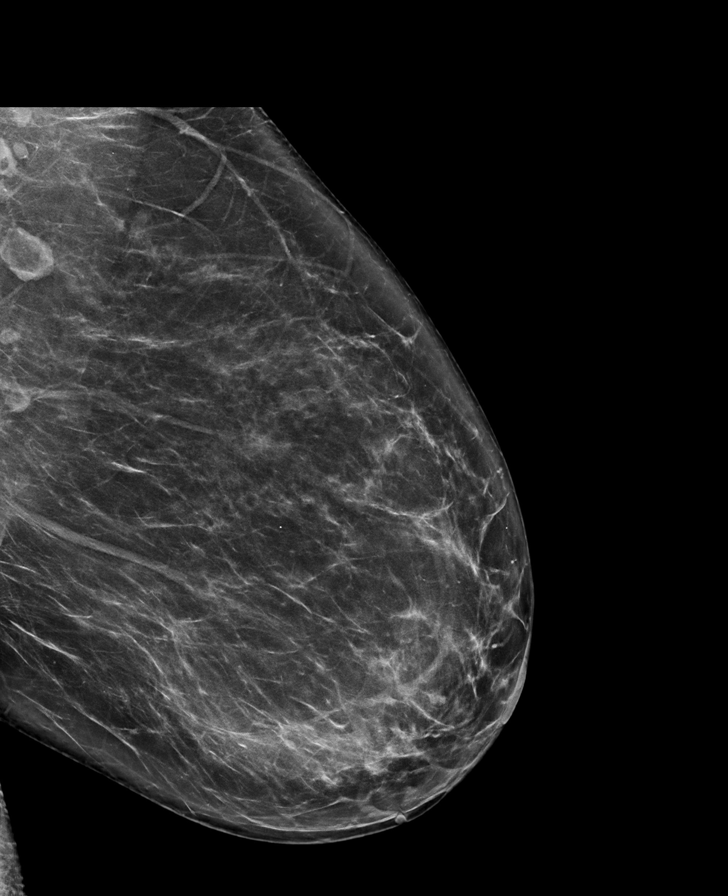

[R CC tomo · tomo slice 35/69.0]
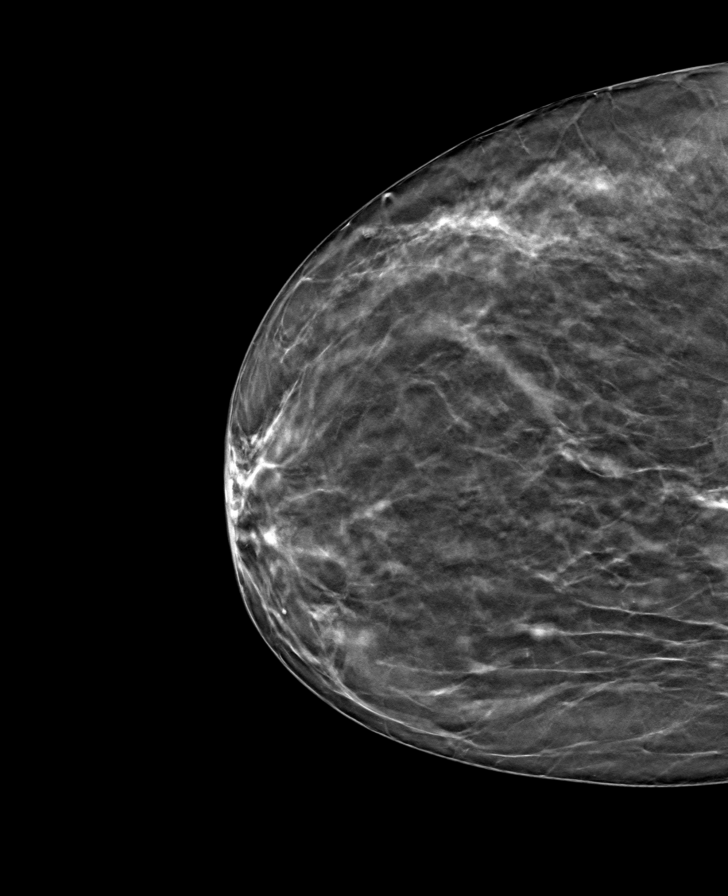

[R MLO tomo · tomo slice 42/83.0]
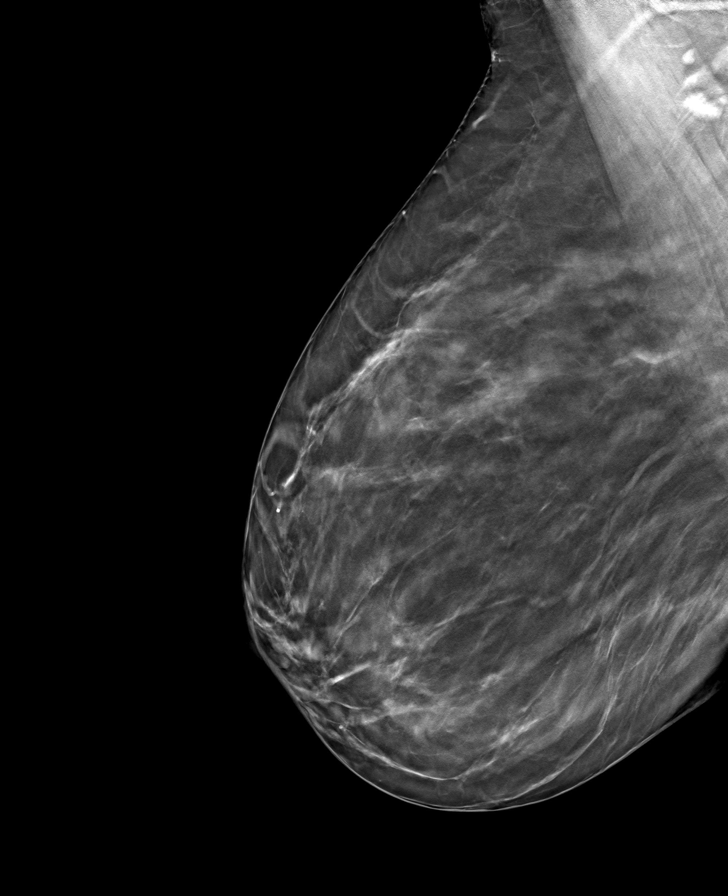

[L MLO tomo · tomo slice 43/84.0]
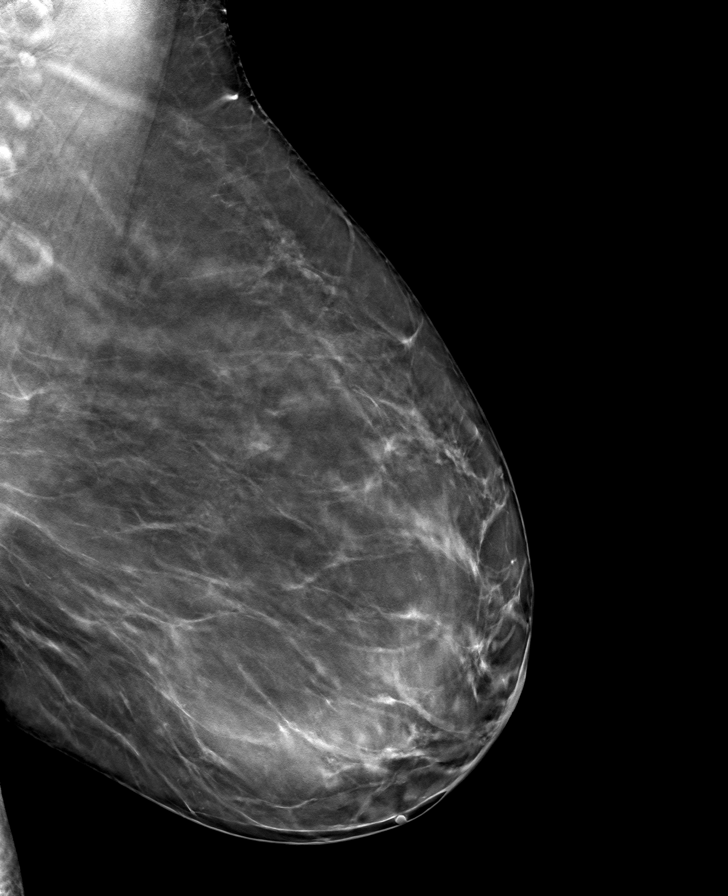

[L CC tomo · tomo slice 37/74.0]
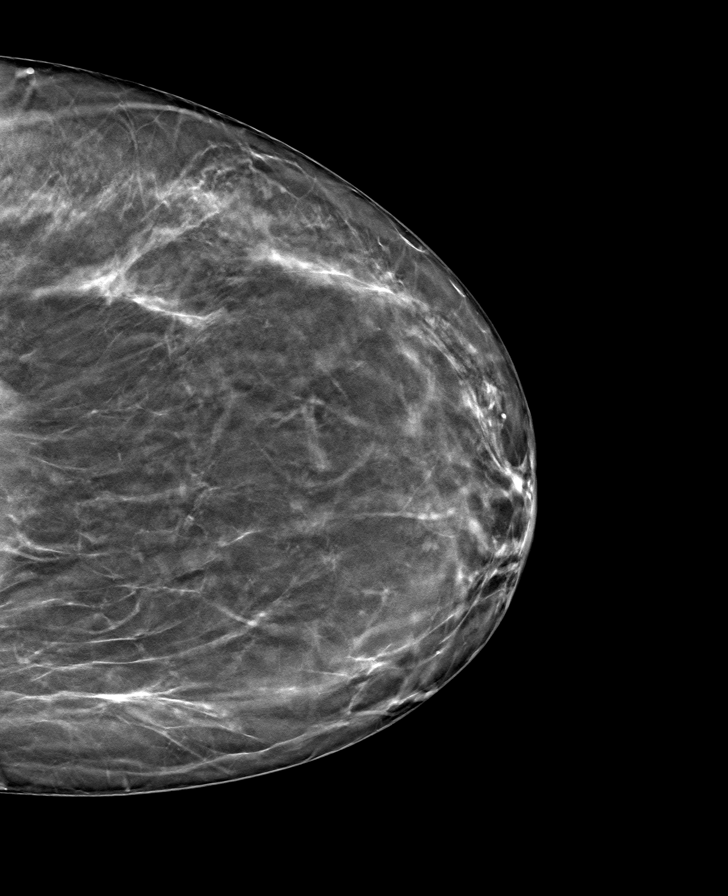

[8 of 24 positions shown; findings below may reference images not displayed]

ACR Breast Density Category b: There are scattered areas of
fibroglandular density.
FINDINGS: There are no findings suspicious for malignancy. Images were
processed with CAD.
IMPRESSION: No mammographic evidence of malignancy. A result letter of this
screening mammogram will be mailed directly to the patient.

RECOMMENDATION:
Screening mammogram in one year. (Code:[id number])

BI-RADS CATEGORY  1: Negative.
